# Patient Record
Sex: Female | Born: 1990 | Race: Black or African American | Hispanic: No | Marital: Single | Smoking: Never smoker
Health system: Southern US, Community
[De-identification: ages and names within clinical notes are randomized; demographics above are authoritative.]

## PROBLEM LIST (undated history)

## (undated) DIAGNOSIS — M359 Systemic involvement of connective tissue, unspecified: Secondary | ICD-10-CM

## (undated) HISTORY — PX: CHOLECYSTECTOMY: SHX55

---

## 2016-10-31 ENCOUNTER — Other Ambulatory Visit: Payer: Self-pay | Admitting: Nephrology

## 2016-10-31 DIAGNOSIS — N059 Unspecified nephritic syndrome with unspecified morphologic changes: Secondary | ICD-10-CM

## 2016-11-05 ENCOUNTER — Other Ambulatory Visit: Payer: Self-pay

## 2016-11-20 ENCOUNTER — Other Ambulatory Visit: Payer: Self-pay

## 2016-11-20 ENCOUNTER — Ambulatory Visit
Admission: RE | Admit: 2016-11-20 | Discharge: 2016-11-20 | Disposition: A | Discharge status: Home / Self Care | Payer: BLUE CROSS/BLUE SHIELD | Source: Ambulatory Visit | Attending: Nephrology | Admitting: Nephrology

## 2016-11-20 DIAGNOSIS — N059 Unspecified nephritic syndrome with unspecified morphologic changes: Secondary | ICD-10-CM

## 2016-12-08 ENCOUNTER — Other Ambulatory Visit (HOSPITAL_COMMUNITY): Payer: Self-pay | Admitting: Nephrology

## 2016-12-08 DIAGNOSIS — D649 Anemia, unspecified: Secondary | ICD-10-CM

## 2016-12-08 DIAGNOSIS — R768 Other specified abnormal immunological findings in serum: Secondary | ICD-10-CM

## 2016-12-08 DIAGNOSIS — N059 Unspecified nephritic syndrome with unspecified morphologic changes: Secondary | ICD-10-CM

## 2016-12-24 ENCOUNTER — Other Ambulatory Visit: Payer: Self-pay | Admitting: Radiology

## 2016-12-25 ENCOUNTER — Other Ambulatory Visit: Payer: Self-pay | Admitting: General Surgery

## 2016-12-25 ENCOUNTER — Other Ambulatory Visit: Payer: Self-pay | Admitting: Physician Assistant

## 2016-12-26 ENCOUNTER — Encounter (HOSPITAL_COMMUNITY): Payer: Self-pay

## 2016-12-26 ENCOUNTER — Ambulatory Visit (HOSPITAL_COMMUNITY)
Admission: RE | Admit: 2016-12-26 | Discharge: 2016-12-26 | Disposition: A | Discharge status: Home / Self Care | Payer: BLUE CROSS/BLUE SHIELD | Source: Ambulatory Visit | Attending: Nephrology | Admitting: Nephrology

## 2016-12-26 DIAGNOSIS — Z9049 Acquired absence of other specified parts of digestive tract: Secondary | ICD-10-CM | POA: Insufficient documentation

## 2016-12-26 DIAGNOSIS — Z833 Family history of diabetes mellitus: Secondary | ICD-10-CM | POA: Diagnosis not present

## 2016-12-26 DIAGNOSIS — R600 Localized edema: Secondary | ICD-10-CM | POA: Diagnosis not present

## 2016-12-26 DIAGNOSIS — N059 Unspecified nephritic syndrome with unspecified morphologic changes: Secondary | ICD-10-CM | POA: Diagnosis not present

## 2016-12-26 DIAGNOSIS — Z8249 Family history of ischemic heart disease and other diseases of the circulatory system: Secondary | ICD-10-CM | POA: Diagnosis not present

## 2016-12-26 DIAGNOSIS — M359 Systemic involvement of connective tissue, unspecified: Secondary | ICD-10-CM | POA: Diagnosis not present

## 2016-12-26 DIAGNOSIS — D649 Anemia, unspecified: Secondary | ICD-10-CM

## 2016-12-26 DIAGNOSIS — Z79899 Other long term (current) drug therapy: Secondary | ICD-10-CM | POA: Diagnosis not present

## 2016-12-26 DIAGNOSIS — R768 Other specified abnormal immunological findings in serum: Secondary | ICD-10-CM | POA: Insufficient documentation

## 2016-12-26 HISTORY — DX: Systemic involvement of connective tissue, unspecified: M35.9

## 2016-12-26 LAB — CBC
HCT: 35.3 % — ABNORMAL LOW (ref 36.0–46.0)
Hemoglobin: 11.3 g/dL — ABNORMAL LOW (ref 12.0–15.0)
MCH: 22.5 pg — AB (ref 26.0–34.0)
MCHC: 32 g/dL (ref 30.0–36.0)
MCV: 70.3 fL — ABNORMAL LOW (ref 78.0–100.0)
PLATELETS: 344 10*3/uL (ref 150–400)
RBC: 5.02 MIL/uL (ref 3.87–5.11)
RDW: 15.6 % — ABNORMAL HIGH (ref 11.5–15.5)
WBC: 9.8 10*3/uL (ref 4.0–10.5)

## 2016-12-26 LAB — PROTIME-INR
INR: 0.98
PROTHROMBIN TIME: 12.9 s (ref 11.4–15.2)

## 2016-12-26 LAB — PREGNANCY, URINE: Preg Test, Ur: NEGATIVE

## 2016-12-26 LAB — APTT: APTT: 27 s (ref 24–36)

## 2016-12-26 MED ORDER — FENTANYL CITRATE (PF) 100 MCG/2ML IJ SOLN
INTRAMUSCULAR | Status: AC
  Filled 2016-12-26: qty 2

## 2016-12-26 MED ORDER — SODIUM CHLORIDE 0.9 % IV SOLN
INTRAVENOUS | Status: DC
Start: 2016-12-26 — End: 2016-12-27

## 2016-12-26 MED ORDER — GELATIN ABSORBABLE 12-7 MM EX MISC
CUTANEOUS | Status: AC
  Filled 2016-12-26: qty 1

## 2016-12-26 MED ORDER — FENTANYL CITRATE (PF) 100 MCG/2ML IJ SOLN
INTRAMUSCULAR | Status: AC | PRN
  Administered 2016-12-26: 50 ug via INTRAVENOUS
  Administered 2016-12-26: 25 ug via INTRAVENOUS

## 2016-12-26 MED ORDER — SODIUM CHLORIDE 0.9 % IV SOLN
INTRAVENOUS | Status: AC | PRN
  Administered 2016-12-26: 10 mL/h via INTRAVENOUS

## 2016-12-26 MED ORDER — MIDAZOLAM HCL 2 MG/2ML IJ SOLN
INTRAMUSCULAR | Status: AC | PRN
  Administered 2016-12-26 (×2): 0.5 mg via INTRAVENOUS
  Administered 2016-12-26: 1 mg via INTRAVENOUS

## 2016-12-26 MED ORDER — LIDOCAINE HCL (PF) 1 % IJ SOLN
INTRAMUSCULAR | Status: AC
Start: 2016-12-26 — End: 2016-12-26
  Filled 2016-12-26: qty 10

## 2016-12-26 NOTE — Discharge Instructions (Addendum)

## 2016-12-26 NOTE — H&P (Signed)
Chief Complaint: Patient was seen in consultation today for random renal biopsy at the request of Coladonato,Joseph  Referring Physician(s): Coladonato,Joseph  Supervising Physician: Ruel Favors  Patient Status: [facility] - Out-pt  History of Present Illness: Doris Terry is a 26 y.o. 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 Autoimmune disease [facility])     Past Surgical History:  Procedure Laterality Date  . CHOLECYSTECTOMY      Allergies: Patient has no known allergies.  Medications: Prior to Admission medications   Medication Sig Start Date End Date Taking? Authorizing Provider  hydroxychloroquine (PLAQUENIL) 200 MG tablet Take 200 mg by mouth 2 (two) times daily.   Yes [provider]  predniSONE (DELTASONE) 10 MG tablet Take 30 mg by mouth daily with breakfast.   Yes [provider]     Family History  Problem Relation Age of Onset  . Hypertension Mother   . Diabetes Mother   . Hyperlipidemia Mother     Social History   Social History  . Marital status: Single    Spouse name: N/A  . Number of children: N/A  . Years of education: N/A   Social History Main Topics  . Smoking status: Never Smoker  . Smokeless tobacco: Never Used  . Alcohol use No  . Drug use: No  . Sexual activity: Not Asked   Other Topics Concern  . None   Social History Narrative  . None    Review of Systems: A 12 point ROS discussed and pertinent positives are indicated in the HPI above.  All other systems are negative.  Review of Systems  Constitutional: Negative for activity change, fatigue and fever.  Respiratory: Negative for cough and shortness of breath.   Cardiovascular: Negative for chest  pain.  Gastrointestinal: Negative for abdominal pain.  Musculoskeletal: Negative for gait problem.  Neurological: Negative for weakness.  Psychiatric/Behavioral: Negative for behavioral problems and confusion.    Vital Signs: BP 120/83   Pulse 72   Temp 98.2 F (36.8 C) (Oral)   Resp 16   Ht  (1.6 m)   Wt 177 lb (80.3 kg)   LMP 12/15/2016   SpO2 100%   BMI 31.35 kg/m   Physical Exam  Constitutional: She is oriented to person, place, and time.  Cardiovascular: Normal rate, regular rhythm and normal heart sounds.   Pulmonary/Chest: Effort normal and breath sounds normal.  Abdominal: Soft. Bowel sounds are normal.  Musculoskeletal: Normal range of motion.  Neurological: She is alert and oriented to person, place, and time.  Skin: Skin is warm and dry.  Psychiatric: She has a normal mood and affect. Her behavior is normal. Judgment and thought content normal.  Nursing note and vitals reviewed.   Imaging: No results found.  Labs:  CBC: No results for input(s): WBC, HGB, HCT, PLT in the last 8760 hours.  COAGS: No results for input(s): INR, APTT in the last 8760 hours.  BMP: No results for input(s): NA, K, CL, CO2, GLUCOSE, BUN, CALCIUM, CREATININE, GFRNONAA, GFRAA in the last 8760 hours.  Invalid input(s): CMP  LIVER FUNCTION TESTS: No results for input(s): BILITOT, AST, ALT, ALKPHOS, PROT, ALBUMIN in the last 8760 hours.  TUMOR MARKERS: No results for input(s): AFPTM, CEA, CA199, CHROMGRNA in the last 8760  hours.  Assessment and Plan:  Autoimmune disease Proteinuria; hematuria Glomerulonephritis Scheduled for random renal biopsy Risks and benefits discussed with the patient including, but not limited to bleeding, infection, damage to adjacent structures or low yield requiring additional tests. All of the patient's questions were answered, patient is agreeable to proceed. Consent signed and in chart.   Thank you for this interesting consult.  I greatly  enjoyed meeting [facility] and look forward to participating in their care.  A copy of this report was sent to the requesting provider on this date.  Electronically Signed: Robet Leu, PA-C 12/26/2016, 6:55 AM   I spent a total of  30 Minutes   in face to face in clinical consultation, greater than 50% of which was counseling/coordinating care for random renal biopsy

## 2016-12-26 NOTE — Procedures (Signed)
CRI, autoimmune disorder  S/p [place] LEFT RENAL BX  No comp Stable EBL 5cc Path pending Full report in pacs

## 2017-01-01 ENCOUNTER — Encounter (HOSPITAL_COMMUNITY): Payer: Self-pay

## 2018-05-21 IMAGING — US US RENAL
1 series · 14 of 25 positions shown · non-contrast
Comparison: None.

CLINICAL DATA: Microscopic hematuria.  No pain.

EXAM:
RENAL / URINARY TRACT ULTRASOUND COMPLETE

[Series 1: us renal · 0.23mm/px · 14 of 29 slices shown]
[im 1/29]
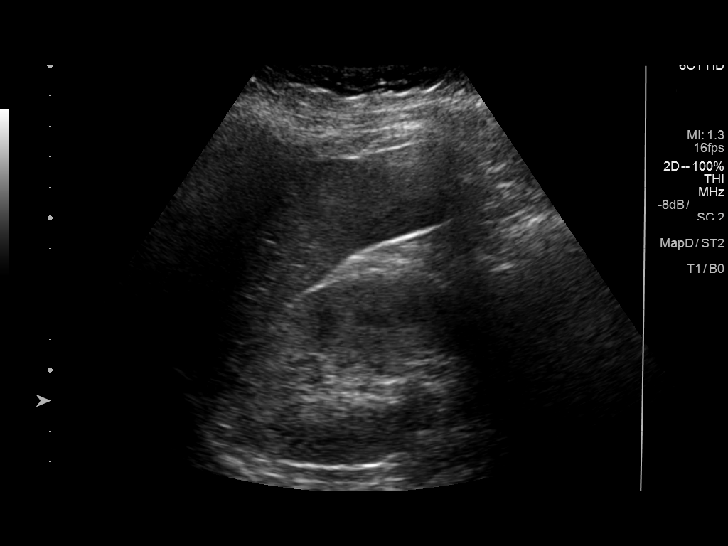
[im 3/29]
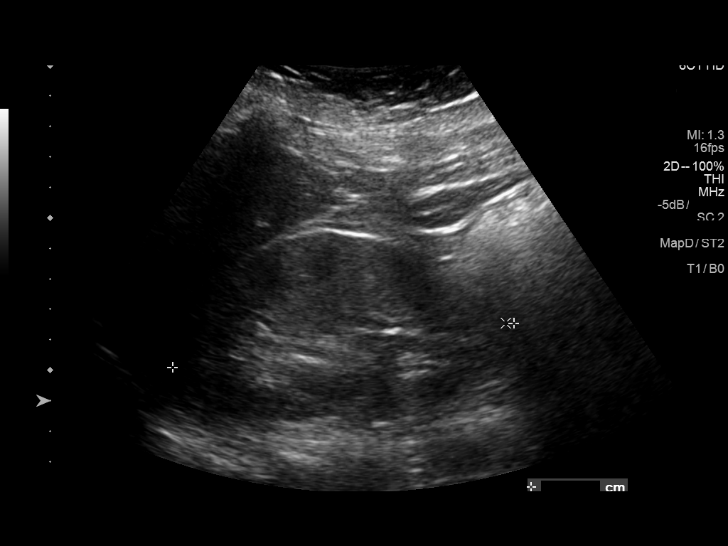
[im 5/29]
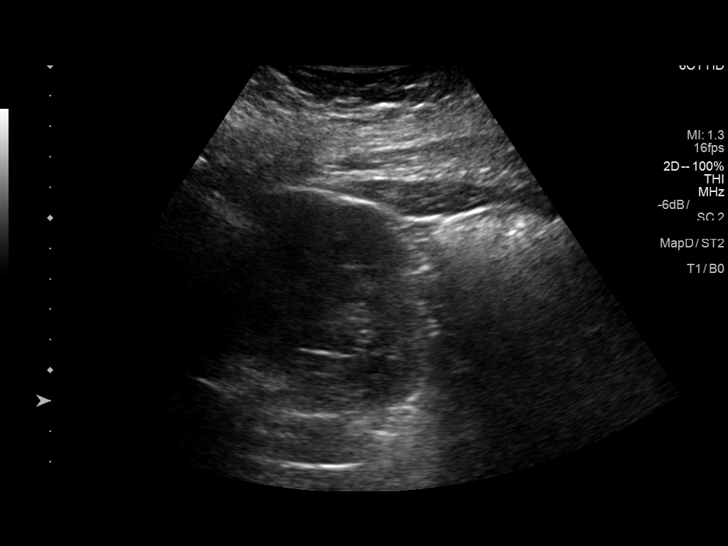
[im 8/29]
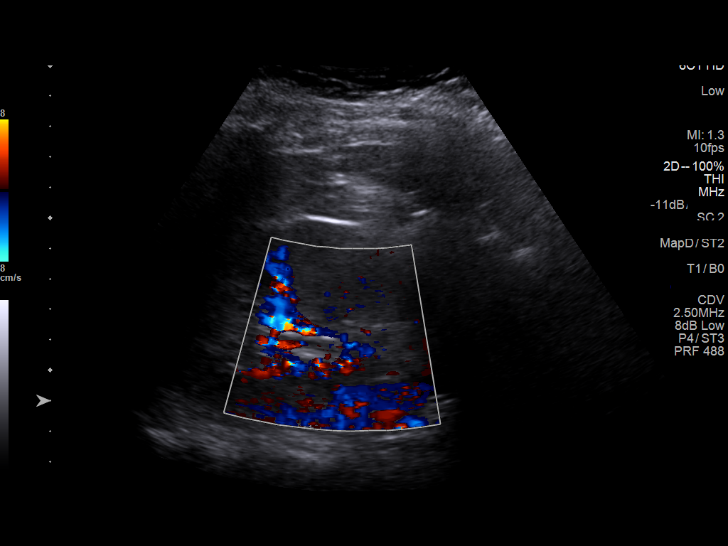
[im 10/29]
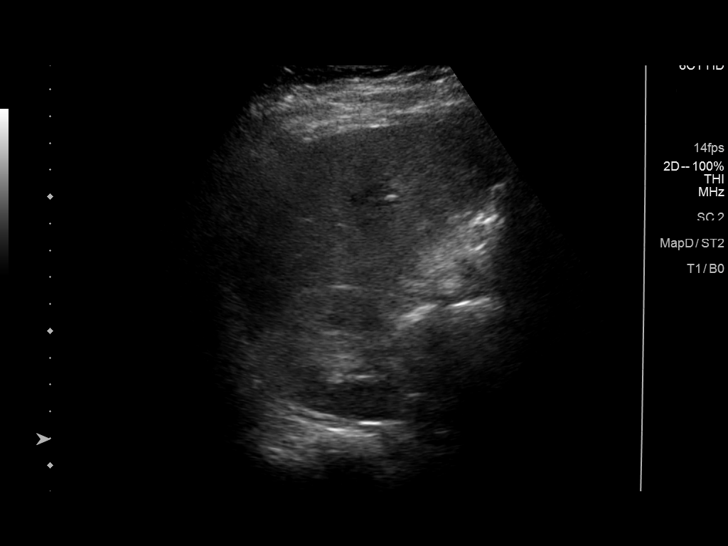
[im 11/29]
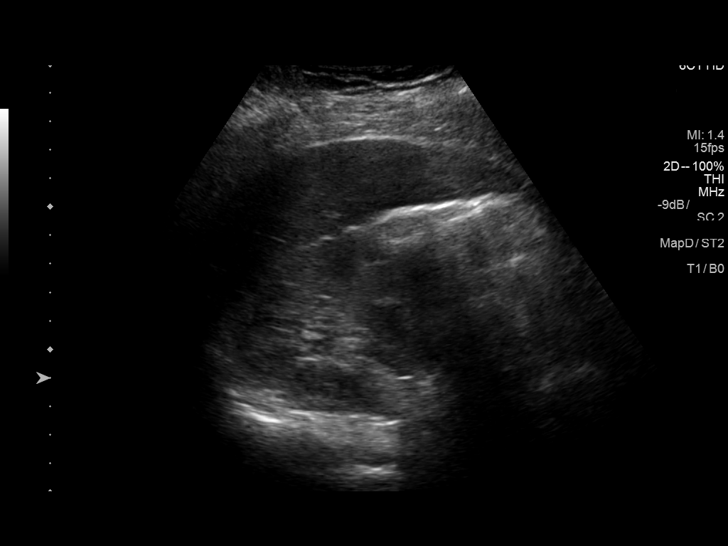
[im 13/29]
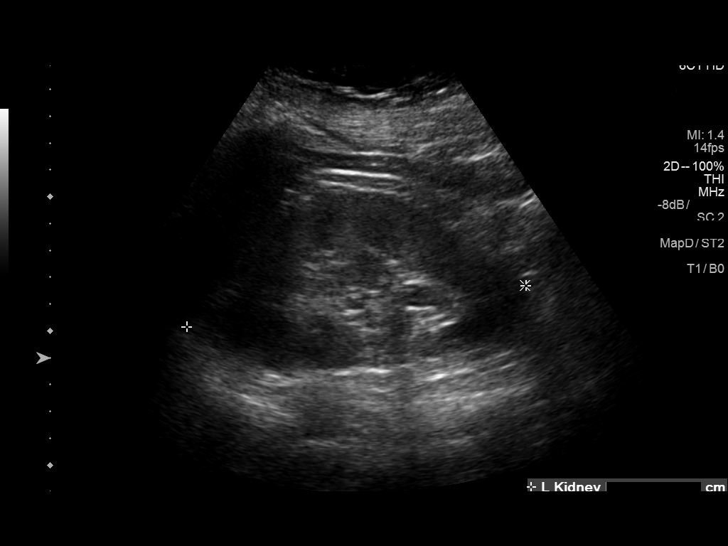
[im 16/29]
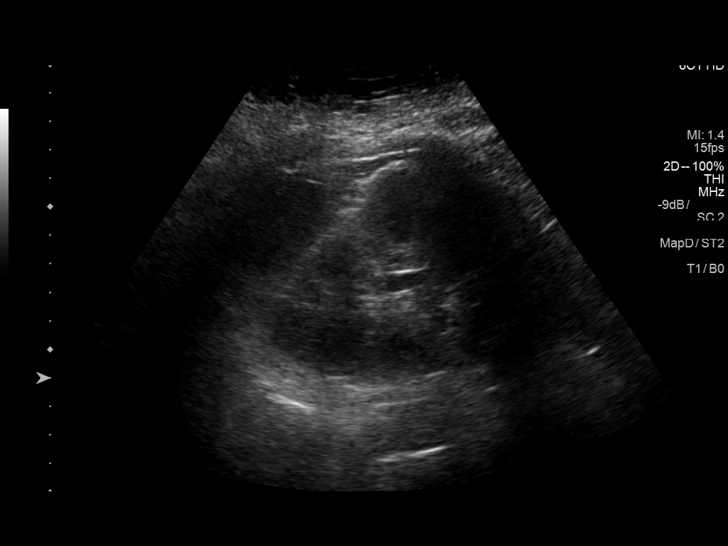
[im 18/29]
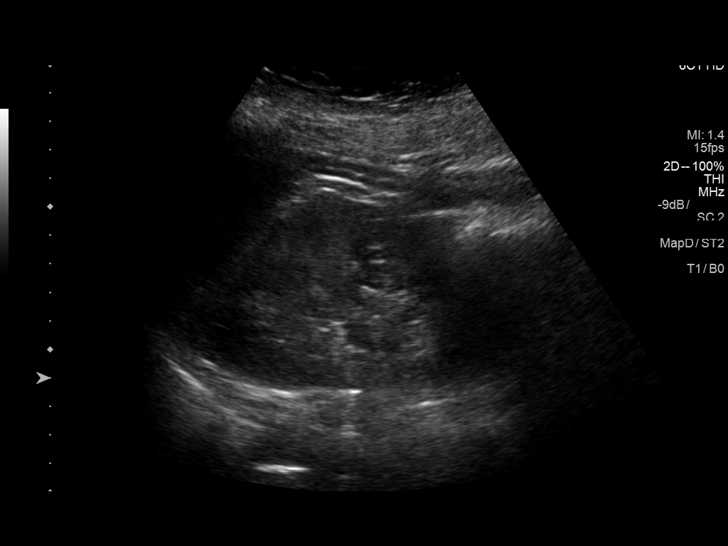
[im 19/29]
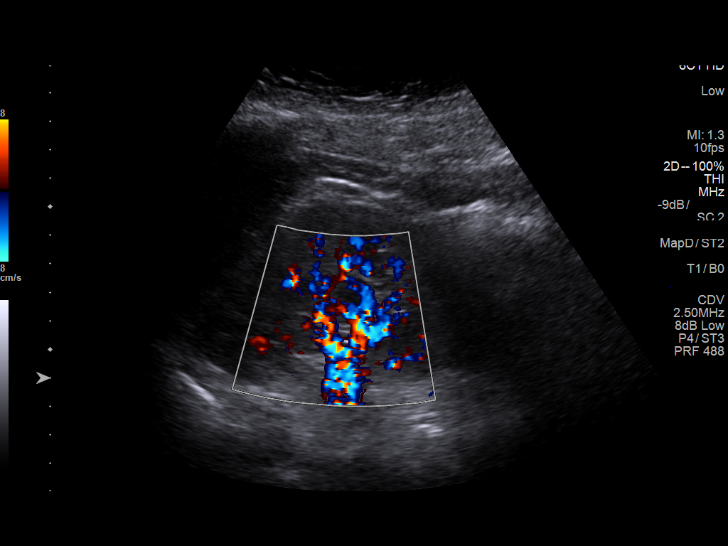
[im 22/29]
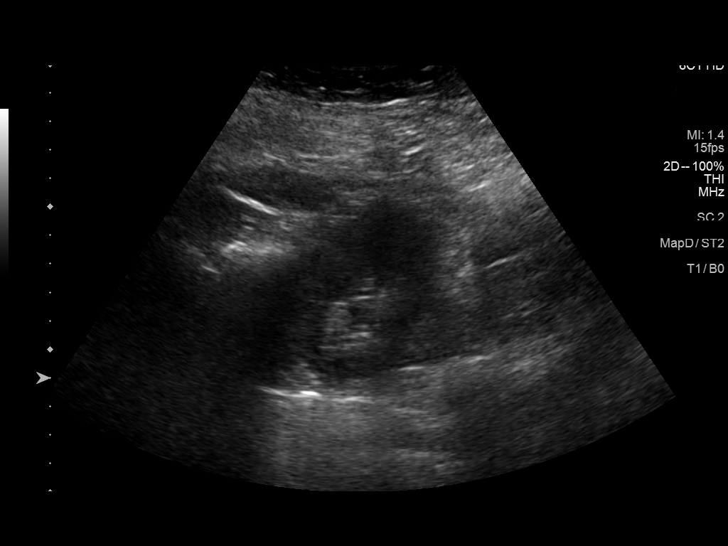
[im 24/29]
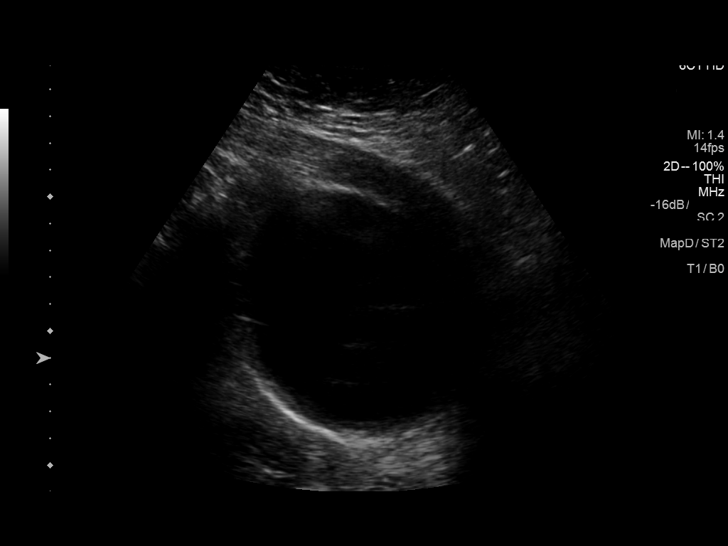
[im 26/29]
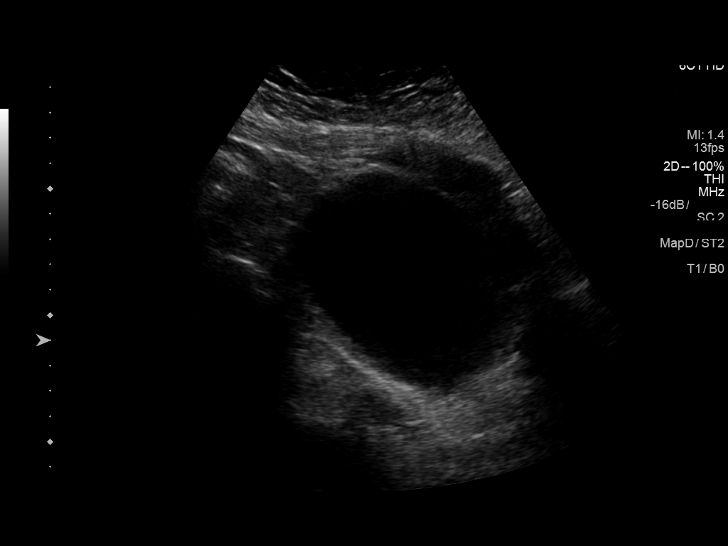
[im 29/29]
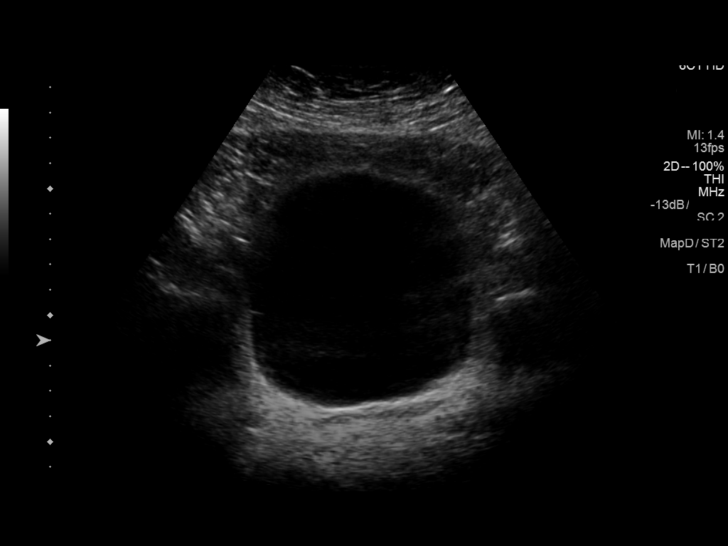

[14 of 25 positions shown; findings below may reference images not displayed]

FINDINGS: Right Kidney:

Length: 12.1 cm. Echogenicity within normal limits. No mass or
hydronephrosis visualized.

Left Kidney:

Length: 12.7 cm. Echogenicity within normal limits. No mass or
hydronephrosis visualized.

Bladder:

Appears normal for degree of bladder distention.
IMPRESSION: Normal study.

## 2018-06-26 IMAGING — US US BIOPSY
1 series · 7 of 7 positions shown · non-contrast
Comparison: none

INDICATION: Autoimmune disorder, glomerular nephritis, chronic renal
insufficiency

[Series 1: us biopsy · 0.20mm/px · 7 of 7 slices shown]
[im 1/7]
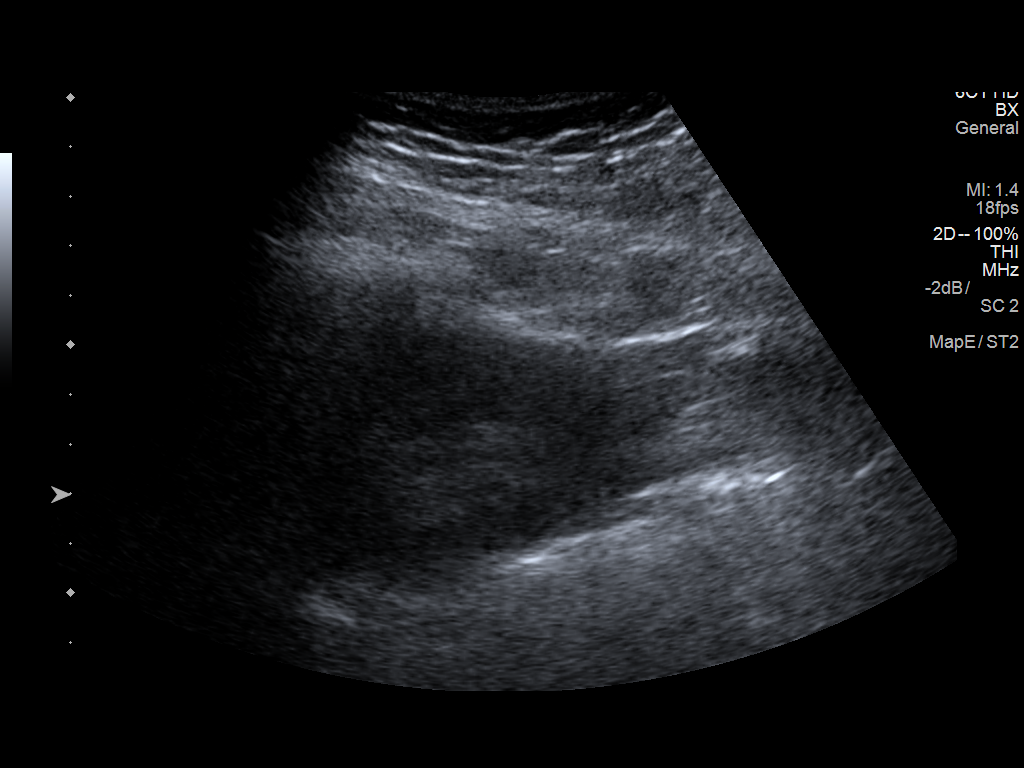
[im 2/7]
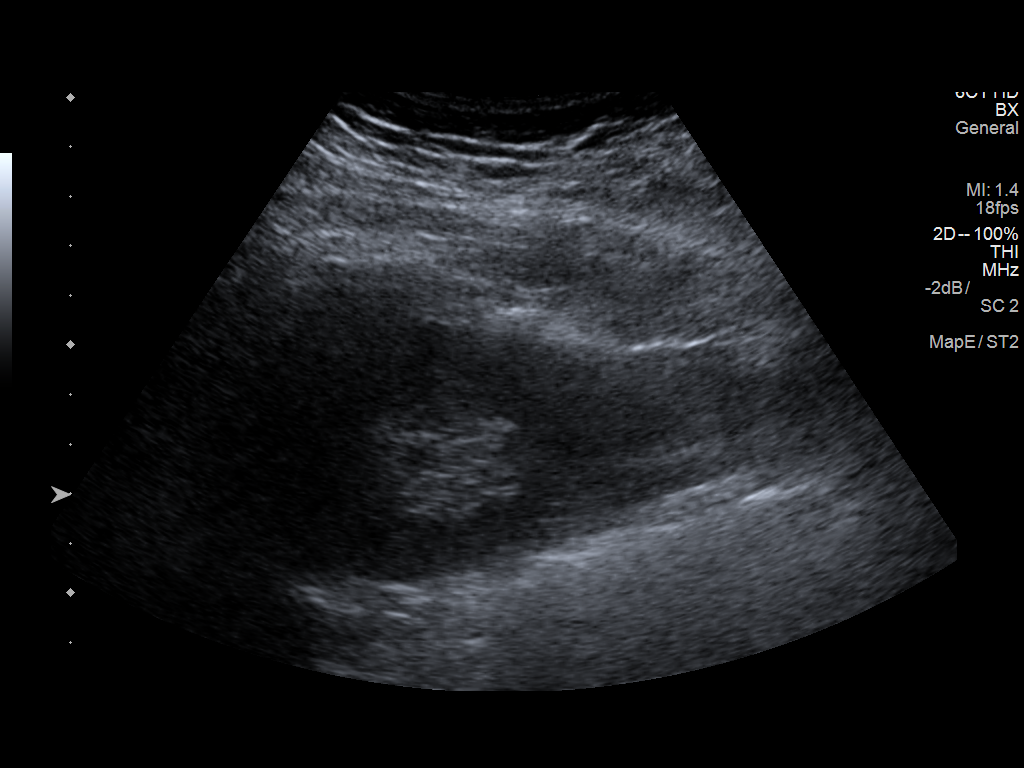
[im 3/7]
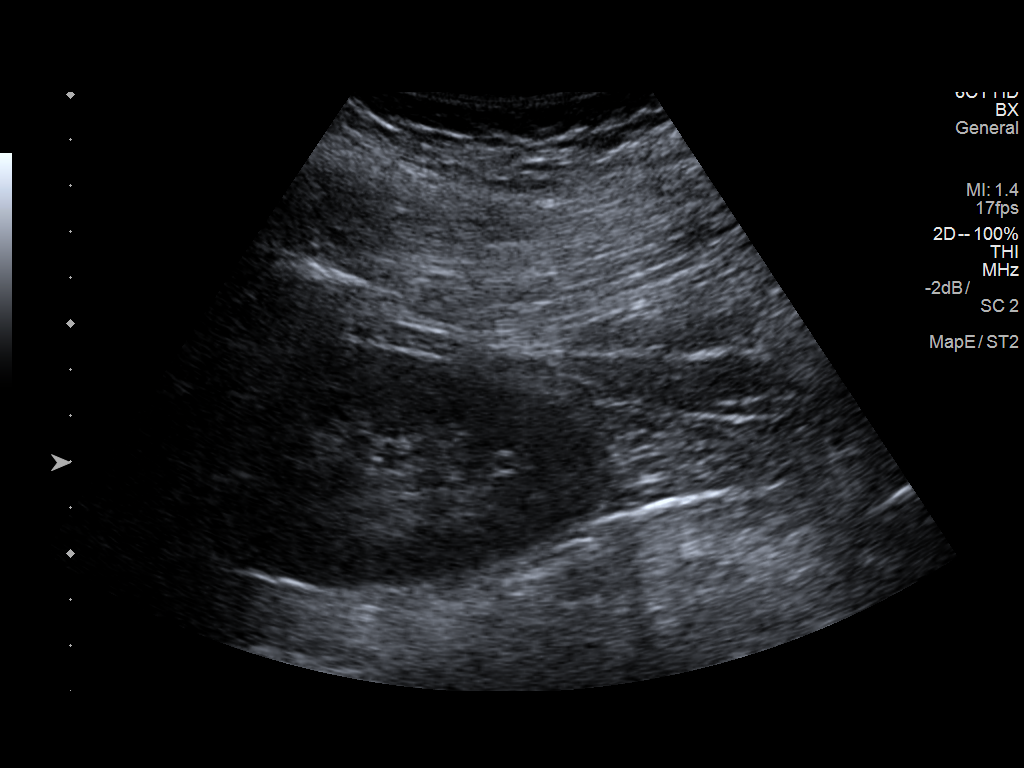
[im 4/7]
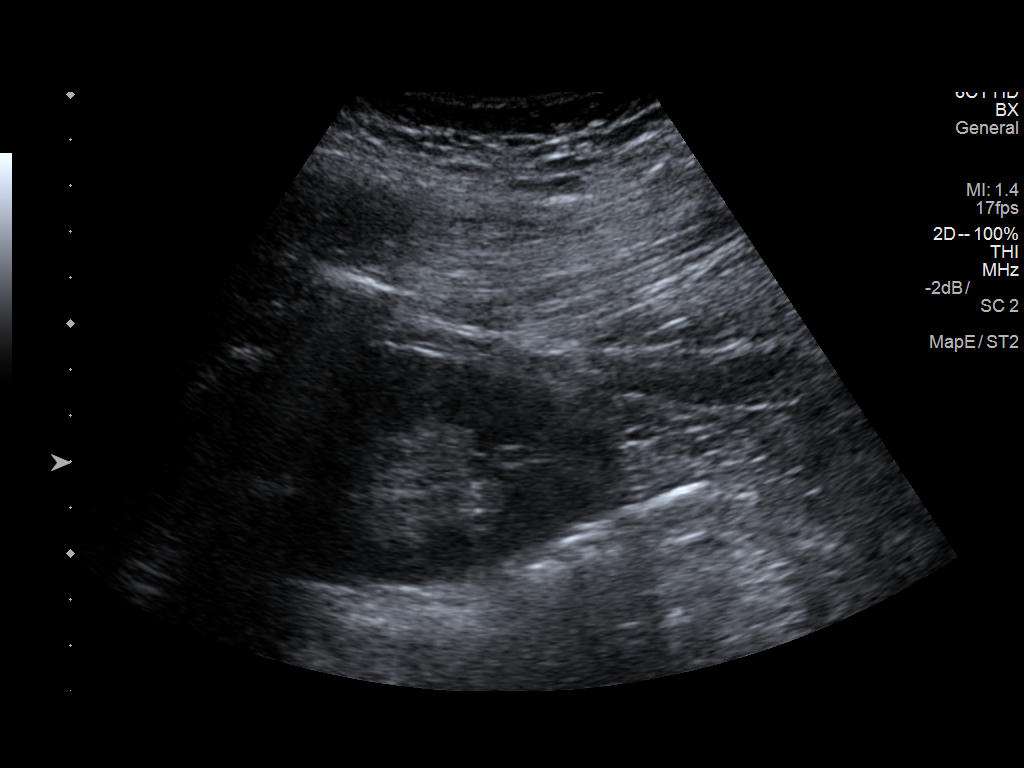
[im 5/7]
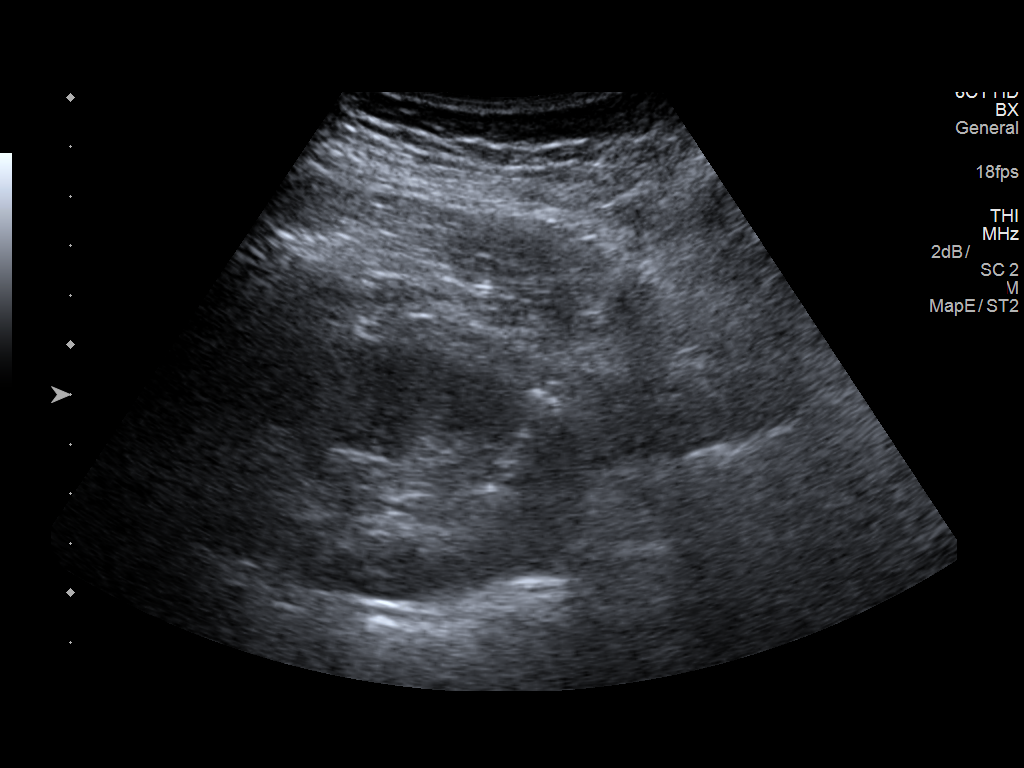
[im 6/7]
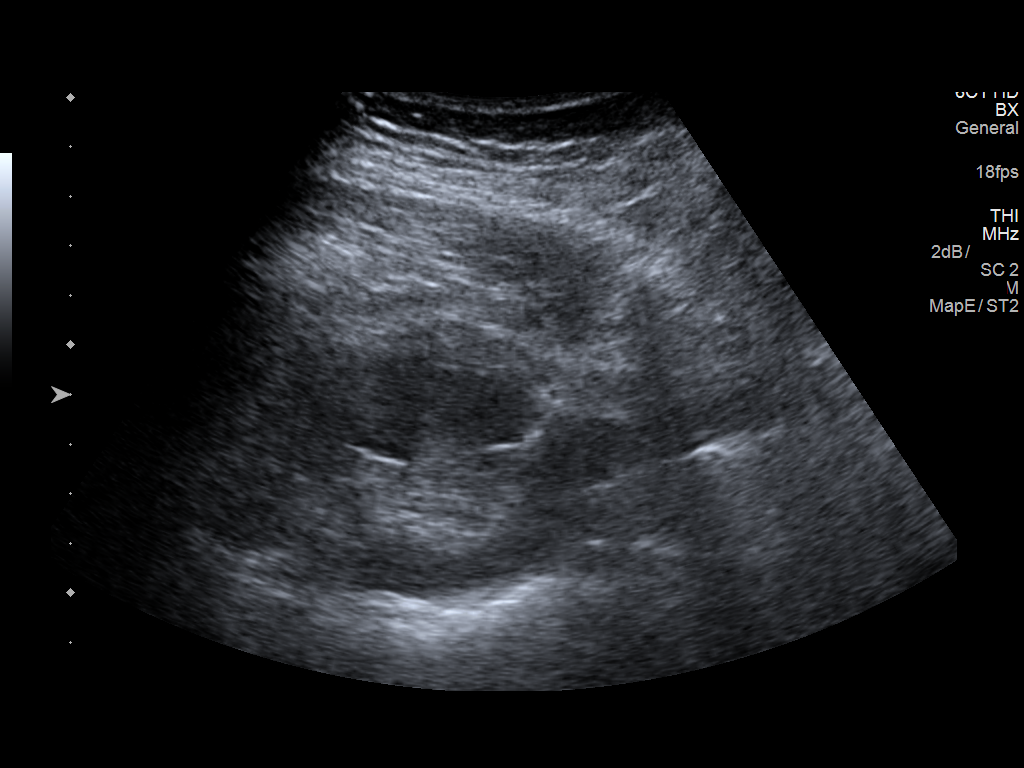
[im 7/7]
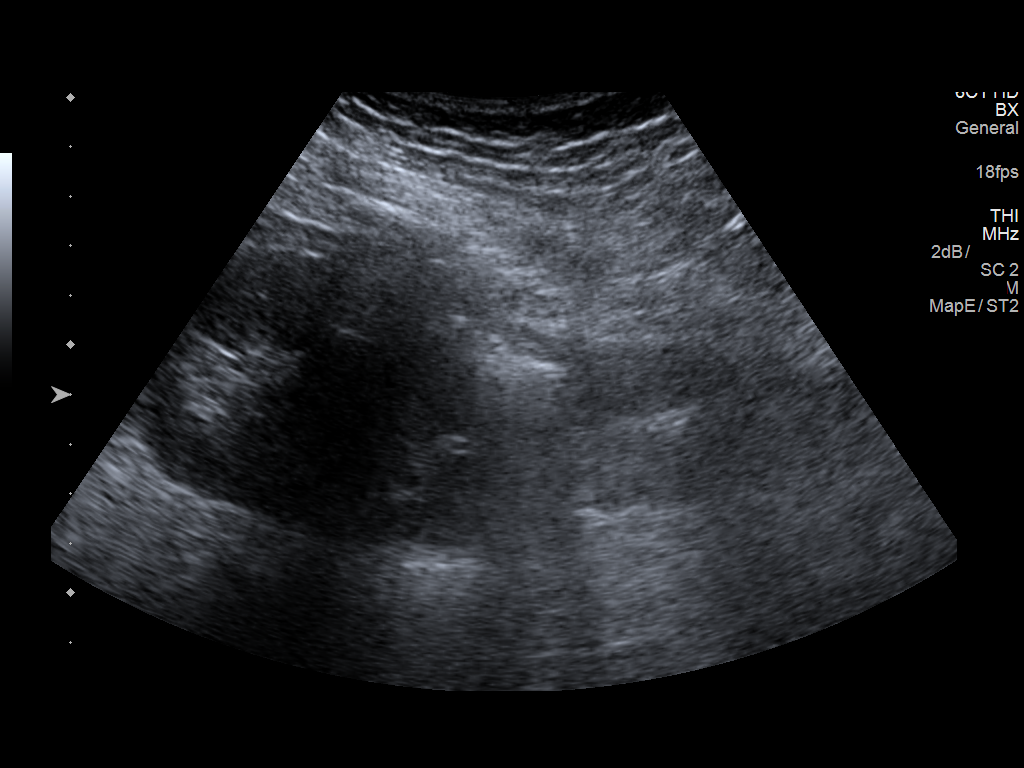

[7 of 7 positions shown; findings below may reference images not displayed]

EXAM:
ULTRASOUND LEFT RENAL CORE BIOPSY

MEDICATIONS:
1% LIDOCAINE LOCAL

ANESTHESIA/SEDATION:
Moderate (conscious) sedation was employed during this procedure. A
total of Versed 2.0 mg and Fentanyl 75 mcg was administered
intravenously.

Moderate Sedation Time: 16 minutes. The patient's level of
consciousness and vital signs were monitored continuously by
radiology nursing throughout the procedure under my direct
supervision.

FLUOROSCOPY TIME:  Fluoroscopy Time: NONE.

COMPLICATIONS:
None immediate.

PROCEDURE:
Informed written consent was obtained from the patient after a
thorough discussion of the procedural risks, benefits and
alternatives. All questions were addressed. Maximal Sterile Barrier
Technique was utilized including caps, mask, sterile gowns, sterile
gloves, sterile drape, hand hygiene and skin antiseptic. A timeout
was performed prior to the initiation of the procedure.

Patient positioned prone. Preliminary ultrasound performed. Left
kidney lower pole was localized and marked. No hydronephrosis. Under
sterile conditions and local anesthesia, a 15 gauge guide needle was
advanced to the left kidney lower pole. 2 16 gauge core biopsies
obtained. Samples placed in saline. These were intact and non
fragmented. Needle tract embolized with Gel-Foam. Postprocedure
imaging demonstrates no hemorrhage or hematoma. Patient tolerated
the biopsy well.
IMPRESSION: Successful ultrasound left renal core biopsy
